# Patient Record
Sex: Male | Born: 1985 | Race: White | Hispanic: No | Marital: Single | Smoking: Never smoker
Health system: Southern US, Community
[De-identification: ages and names within clinical notes are randomized; demographics above are authoritative.]

---

## 2011-08-25 ENCOUNTER — Ambulatory Visit (INDEPENDENT_AMBULATORY_CARE_PROVIDER_SITE_OTHER): Payer: BC Managed Care – PPO | Admitting: Emergency Medicine

## 2011-08-25 VITALS — BP 100/70 | HR 90 | Temp 98.3°F | Resp 16 | Ht 76.0 in | Wt 169.0 lb

## 2011-08-25 DIAGNOSIS — L723 Sebaceous cyst: Secondary | ICD-10-CM

## 2011-08-25 DIAGNOSIS — B081 Molluscum contagiosum: Secondary | ICD-10-CM

## 2011-08-25 NOTE — Patient Instructions (Signed)
HPV Test The HPV (Human papillomavirus) test isused to screen for high-risk types with HPV infection. HPV is a group of about 100 related viruses, of which 40 types are genital viruses. Most HPV viruses cause infections that usually resolve without treatment within 2 years. Some HPV infections can cause skin and genital warts (condylomata). HPV types 16, 18, 31 and 45 are considered high-risk types of HPV. High-risk types of HPV do not usually cause visible warts, but if untreated, may lead to cancers of the outlet of the womb (cervix) or anus. An HPV test identifies the DNA (genetic) strands of the HPV infection. Because the test identifies the DNA strands, the test is also referred to as the HPV DNA test. Although HPV is found in both males and females, the HPV test is only used to screen for cervical cancer in females. This test is recommended for females:  With an abnormal Pap test.   After treatment of an abnormal Pap test.   Aged 59 and older.   After treatment of a high-risk HPV infection.  The HPV test may be done at the same time as a Pap test in females over the age of 65. Both the HPV and Pap test require a sample of cells from the cervix. PREPARATION FOR TEST  You may be asked to avoid douching, tampons or birth canal (vaginal) medicines for 48 hours before the HPV test. You will be asked to urinate before the test. For the HPV test, you will need to lie on an exam table with your feet in stirrups. A spatula will be inserted into the vagina. The spatula will be used to swab the cervix for a cell and mucus sample. The sample will be further evaluated in a lab under a microscope. NORMAL FINDINGS  Normal: High-risk HPV is not found.  Ranges for normal findings may vary among different laboratories and hospitals. You should always check with your doctor after having lab work or other tests done to discuss the meaning of your test results and whether your values are considered within normal  limits. MEANING OF TEST An abnormal HPV test means that high-risk HPV is found. Your caregiver may recommend further testing. Your caregiver will go over the test results with you and discuss the importance and meaning of your results, as well as treatment options and the need for additional tests, if necessary. OBTAINING THE RESULTS  It is your responsibility to obtain your test results. Ask the lab or department performing the test when and how you will get your results. Document Released: 01/20/2004 Document Revised: 12/14/2010 Document Reviewed: 10/04/2004 [facility] Patient Information 2012 [place], [place].

## 2011-08-25 NOTE — Progress Notes (Signed)
   Date:  08/25/2011   Name:  Shawn Franklin   DOB:  1985/05/27   MRN:  [id number] Gender: male  Age: 26 y.o.  PCP:  Tally Due, MD    Chief Complaint: Rash   History of Present Illness:  Shawn Franklin is a 26 y.o. pleasant patient who presents with the following:  Relatively sexually inexperienced man with eruption on suprapubic region and base of penis over the past month or so.  No discharge, pain, blisters, any other rash or complaint.  There is no problem list on file for this patient.   No past medical history on file.  No past surgical history on file.  History  Substance Use Topics  . Smoking status: Never Smoker   . Smokeless tobacco: Not on file  . Alcohol Use: Not on file    No family history on file.  No Known Allergies  Medication list has been reviewed and updated.  No current outpatient prescriptions on file prior to visit.    Review of Systems:  As per HPI, otherwise negative.    Physical Examination: Filed Vitals:   08/25/11 1411  BP: 100/70  Pulse: 90  Temp: 98.3 F (36.8 C)  Resp: 16   Filed Vitals:   08/25/11 1411  Height: 6\' 4"  (1.93 m)  Weight: 169 lb (76.658 kg)   Body mass index is 20.57 kg/(m^2). Ideal Body Weight: Weight in (lb) to have BMI = 25: 205    GEN: WDWN, NAD, Non-toxic, Alert & Oriented x 3 HEENT: Atraumatic, Normocephalic.  Ears and Nose: No external deformity. EXTR: No clubbing/cyanosis/edema NEURO: Normal gait.  PSYCH: Normally interactive. Conversant. Not depressed or anxious appearing.  Calm demeanor.  Genitalia:  Normal male circumcised.  Scrotum and testes normal.  Clusters of discrete 1 mm diam erythematous papules with few on base of phallus.  Assessment and Plan: Molluscum contagiosum  Derm referral  Carmelina Dane, MD

## 2011-10-03 ENCOUNTER — Ambulatory Visit (INDEPENDENT_AMBULATORY_CARE_PROVIDER_SITE_OTHER): Payer: No Typology Code available for payment source | Admitting: Licensed Clinical Social Worker

## 2011-10-03 DIAGNOSIS — F411 Generalized anxiety disorder: Secondary | ICD-10-CM

## 2012-04-22 ENCOUNTER — Ambulatory Visit: Payer: Self-pay

## 2014-07-08 ENCOUNTER — Observation Stay (HOSPITAL_COMMUNITY): Payer: 59 | Admitting: Anesthesiology

## 2014-07-08 ENCOUNTER — Emergency Department (HOSPITAL_COMMUNITY): Payer: 59

## 2014-07-08 ENCOUNTER — Encounter (HOSPITAL_COMMUNITY): Payer: Self-pay | Admitting: Emergency Medicine

## 2014-07-08 ENCOUNTER — Observation Stay (HOSPITAL_COMMUNITY)
Admission: EM | Admit: 2014-07-08 | Discharge: 2014-07-09 | Disposition: A | Discharge status: Home / Self Care | Payer: 59 | Attending: General Surgery | Admitting: General Surgery

## 2014-07-08 ENCOUNTER — Encounter (HOSPITAL_COMMUNITY): Admission: EM | Disposition: A | Discharge status: Home / Self Care | Payer: Self-pay | Source: Home / Self Care

## 2014-07-08 DIAGNOSIS — D72829 Elevated white blood cell count, unspecified: Secondary | ICD-10-CM | POA: Diagnosis not present

## 2014-07-08 DIAGNOSIS — K353 Acute appendicitis with localized peritonitis, without perforation or gangrene: Secondary | ICD-10-CM

## 2014-07-08 DIAGNOSIS — N289 Disorder of kidney and ureter, unspecified: Secondary | ICD-10-CM | POA: Insufficient documentation

## 2014-07-08 DIAGNOSIS — K5730 Diverticulosis of large intestine without perforation or abscess without bleeding: Secondary | ICD-10-CM | POA: Insufficient documentation

## 2014-07-08 DIAGNOSIS — N281 Cyst of kidney, acquired: Secondary | ICD-10-CM | POA: Diagnosis not present

## 2014-07-08 DIAGNOSIS — K37 Unspecified appendicitis: Secondary | ICD-10-CM | POA: Diagnosis present

## 2014-07-08 DIAGNOSIS — R109 Unspecified abdominal pain: Secondary | ICD-10-CM | POA: Diagnosis present

## 2014-07-08 DIAGNOSIS — K3580 Unspecified acute appendicitis: Secondary | ICD-10-CM | POA: Diagnosis present

## 2014-07-08 HISTORY — PX: LAPAROSCOPIC APPENDECTOMY: SHX408

## 2014-07-08 LAB — CBC WITH DIFFERENTIAL/PLATELET
Basophils Absolute: 0 K/uL (ref 0.0–0.1)
Basophils Relative: 0 % (ref 0–1)
Eosinophils Absolute: 0.1 K/uL (ref 0.0–0.7)
Eosinophils Relative: 1 % (ref 0–5)
HCT: 41.9 % (ref 39.0–52.0)
Hemoglobin: 14.4 g/dL (ref 13.0–17.0)
Lymphocytes Relative: 13 % (ref 12–46)
Lymphs Abs: 1.9 K/uL (ref 0.7–4.0)
MCH: 28.3 pg (ref 26.0–34.0)
MCHC: 34.4 g/dL (ref 30.0–36.0)
MCV: 82.3 fL (ref 78.0–100.0)
Monocytes Absolute: 0.8 K/uL (ref 0.1–1.0)
Monocytes Relative: 6 % (ref 3–12)
Neutro Abs: 12.2 K/uL — ABNORMAL HIGH (ref 1.7–7.7)
Neutrophils Relative %: 80 % — ABNORMAL HIGH (ref 43–77)
Platelets: 213 K/uL (ref 150–400)
RBC: 5.09 MIL/uL (ref 4.22–5.81)
RDW: 12.2 % (ref 11.5–15.5)
WBC: 15 K/uL — ABNORMAL HIGH (ref 4.0–10.5)

## 2014-07-08 LAB — BASIC METABOLIC PANEL
ANION GAP: 7 (ref 5–15)
BUN: 24 mg/dL — ABNORMAL HIGH (ref 6–20)
CO2: 24 mmol/L (ref 22–32)
CREATININE: 1.38 mg/dL — AB (ref 0.61–1.24)
Calcium: 9 mg/dL (ref 8.9–10.3)
Chloride: 105 mmol/L (ref 101–111)
GFR calc non Af Amer: >60 mL/min (ref >60)
GLUCOSE: 129 mg/dL — AB (ref 65–99)
Potassium: 3.6 mmol/L (ref 3.5–5.1)
SODIUM: 136 mmol/L (ref 135–145)

## 2014-07-08 LAB — URINALYSIS, ROUTINE W REFLEX MICROSCOPIC
Bilirubin Urine: NEGATIVE
Glucose, UA: NEGATIVE mg/dL
Hgb urine dipstick: NEGATIVE
Ketones, ur: NEGATIVE mg/dL
Leukocytes, UA: NEGATIVE
Nitrite: NEGATIVE
Protein, ur: NEGATIVE mg/dL
Specific Gravity, Urine: 1.023 (ref 1.005–1.030)
Urobilinogen, UA: 0.2 mg/dL (ref 0.0–1.0)
pH: 7.5 (ref 5.0–8.0)

## 2014-07-08 LAB — SURGICAL PCR SCREEN
MRSA, PCR: NEGATIVE
Staphylococcus aureus: POSITIVE — AB

## 2014-07-08 SURGERY — APPENDECTOMY, LAPAROSCOPIC
Anesthesia: General | Site: Abdomen

## 2014-07-08 MED ORDER — BUPIVACAINE-EPINEPHRINE 0.5% -1:200000 IJ SOLN
INTRAMUSCULAR | Status: DC | PRN
  Administered 2014-07-08: 30 mL

## 2014-07-08 MED ORDER — BUPIVACAINE-EPINEPHRINE (PF) 0.25% -1:200000 IJ SOLN
INTRAMUSCULAR | Status: AC
  Filled 2014-07-08: qty 30

## 2014-07-08 MED ORDER — FENTANYL CITRATE (PF) 100 MCG/2ML IJ SOLN: 25.0000 ug | INTRAMUSCULAR | Status: DC | PRN

## 2014-07-08 MED ORDER — DEXTROSE 5 % IV SOLN
INTRAVENOUS | Status: AC
  Filled 2014-07-08: qty 2

## 2014-07-08 MED ORDER — DIPHENHYDRAMINE HCL 50 MG/ML IJ SOLN: 12.5000 mg | Freq: Four times a day (QID) | INTRAMUSCULAR | Status: DC | PRN

## 2014-07-08 MED ORDER — DEXAMETHASONE SODIUM PHOSPHATE 10 MG/ML IJ SOLN
INTRAMUSCULAR | Status: AC
  Filled 2014-07-08: qty 1

## 2014-07-08 MED ORDER — LIDOCAINE HCL (PF) 2 % IJ SOLN
INTRAMUSCULAR | Status: DC | PRN
  Administered 2014-07-08: 20 mg via INTRADERMAL

## 2014-07-08 MED ORDER — KETOROLAC TROMETHAMINE 30 MG/ML IJ SOLN
INTRAMUSCULAR | Status: AC
  Filled 2014-07-08: qty 1

## 2014-07-08 MED ORDER — GLYCOPYRROLATE 0.2 MG/ML IJ SOLN
INTRAMUSCULAR | Status: AC
  Filled 2014-07-08: qty 2

## 2014-07-08 MED ORDER — HEPARIN SODIUM (PORCINE) 5000 UNIT/ML IJ SOLN
5000.0000 [IU] | Freq: Three times a day (TID) | INTRAMUSCULAR | Status: DC
  Administered 2014-07-08 – 2014-07-09 (×2): 5000 [IU] via SUBCUTANEOUS
  Filled 2014-07-08 (×5): qty 1

## 2014-07-08 MED ORDER — LACTATED RINGERS IV SOLN
INTRAVENOUS | Status: DC
  Administered 2014-07-08: 1000 mL via INTRAVENOUS

## 2014-07-08 MED ORDER — IOHEXOL 300 MG/ML  SOLN
100.0000 mL | Freq: Once | INTRAMUSCULAR | Status: AC | PRN
  Administered 2014-07-08: 100 mL via INTRAVENOUS

## 2014-07-08 MED ORDER — LACTATED RINGERS IR SOLN
Status: DC | PRN
  Administered 2014-07-08: 1

## 2014-07-08 MED ORDER — DEXAMETHASONE SODIUM PHOSPHATE 10 MG/ML IJ SOLN
INTRAMUSCULAR | Status: DC | PRN
  Administered 2014-07-08: 10 mg via INTRAVENOUS

## 2014-07-08 MED ORDER — DOCUSATE SODIUM 100 MG PO CAPS
100.0000 mg | ORAL_CAPSULE | Freq: Two times a day (BID) | ORAL | Status: DC
Start: 2014-07-08 — End: 2014-07-09
  Administered 2014-07-08 – 2014-07-09 (×3): 100 mg via ORAL
  Filled 2014-07-08 (×4): qty 1

## 2014-07-08 MED ORDER — SUCCINYLCHOLINE CHLORIDE 20 MG/ML IJ SOLN
INTRAMUSCULAR | Status: DC | PRN
  Administered 2014-07-08: 100 mg via INTRAVENOUS

## 2014-07-08 MED ORDER — ONDANSETRON HCL 4 MG/2ML IJ SOLN
4.0000 mg | Freq: Four times a day (QID) | INTRAMUSCULAR | Status: DC | PRN
  Administered 2014-07-08: 4 mg via INTRAVENOUS

## 2014-07-08 MED ORDER — FENTANYL CITRATE (PF) 100 MCG/2ML IJ SOLN
INTRAMUSCULAR | Status: DC | PRN
Start: 2014-07-08 — End: 2014-07-08
  Administered 2014-07-08: 50 ug via INTRAVENOUS
  Administered 2014-07-08: 100 ug via INTRAVENOUS

## 2014-07-08 MED ORDER — ROCURONIUM BROMIDE 100 MG/10ML IV SOLN
INTRAVENOUS | Status: DC | PRN
  Administered 2014-07-08: 30 mg via INTRAVENOUS

## 2014-07-08 MED ORDER — KETOROLAC TROMETHAMINE 30 MG/ML IJ SOLN
INTRAMUSCULAR | Status: DC | PRN
  Administered 2014-07-08: 30 mg via INTRAVENOUS

## 2014-07-08 MED ORDER — ONDANSETRON HCL 4 MG/2ML IJ SOLN: 4.0000 mg | Freq: Once | INTRAMUSCULAR | Status: DC | PRN

## 2014-07-08 MED ORDER — ONDANSETRON HCL 4 MG/2ML IJ SOLN
INTRAMUSCULAR | Status: AC
  Filled 2014-07-08: qty 2

## 2014-07-08 MED ORDER — FENTANYL CITRATE (PF) 250 MCG/5ML IJ SOLN
INTRAMUSCULAR | Status: AC
  Filled 2014-07-08: qty 5

## 2014-07-08 MED ORDER — SODIUM CHLORIDE 0.9 % IV SOLN
INTRAVENOUS | Status: DC
  Administered 2014-07-08: 17:00:00 via INTRAVENOUS

## 2014-07-08 MED ORDER — GLYCOPYRROLATE 0.2 MG/ML IJ SOLN
INTRAMUSCULAR | Status: DC | PRN
  Administered 2014-07-08: 0.4 mg via INTRAVENOUS

## 2014-07-08 MED ORDER — MORPHINE SULFATE 2 MG/ML IJ SOLN: 1.0000 mg | INTRAMUSCULAR | Status: DC | PRN

## 2014-07-08 MED ORDER — NEOSTIGMINE METHYLSULFATE 10 MG/10ML IV SOLN
INTRAVENOUS | Status: DC | PRN
  Administered 2014-07-08: 3 mg via INTRAVENOUS

## 2014-07-08 MED ORDER — OXYCODONE-ACETAMINOPHEN 5-325 MG PO TABS: 1.0000 | ORAL_TABLET | ORAL | Status: DC | PRN

## 2014-07-08 MED ORDER — DIPHENHYDRAMINE HCL 12.5 MG/5ML PO ELIX: 12.5000 mg | ORAL_SOLUTION | Freq: Four times a day (QID) | ORAL | Status: DC | PRN

## 2014-07-08 MED ORDER — IOHEXOL 300 MG/ML  SOLN
25.0000 mL | Freq: Once | INTRAMUSCULAR | Status: AC | PRN
  Administered 2014-07-08: 25 mL via ORAL

## 2014-07-08 MED ORDER — MIDAZOLAM HCL 5 MG/5ML IJ SOLN
INTRAMUSCULAR | Status: DC | PRN
  Administered 2014-07-08: 2 mg via INTRAVENOUS

## 2014-07-08 MED ORDER — LIDOCAINE HCL (CARDIAC) 20 MG/ML IV SOLN
INTRAVENOUS | Status: AC
  Filled 2014-07-08: qty 5

## 2014-07-08 MED ORDER — DEXTROSE 5 % IV SOLN
1.0000 g | Freq: Three times a day (TID) | INTRAVENOUS | Status: DC
  Administered 2014-07-08: 1 g via INTRAVENOUS
  Administered 2014-07-08: 2 g via INTRAVENOUS
  Filled 2014-07-08 (×2): qty 1

## 2014-07-08 MED ORDER — MIDAZOLAM HCL 2 MG/2ML IJ SOLN
INTRAMUSCULAR | Status: AC
  Filled 2014-07-08: qty 2

## 2014-07-08 MED ORDER — PROPOFOL 10 MG/ML IV BOLUS
INTRAVENOUS | Status: DC | PRN
  Administered 2014-07-08: 200 mg via INTRAVENOUS

## 2014-07-08 MED ORDER — SODIUM CHLORIDE 0.9 % IV SOLN
3.0000 g | Freq: Once | INTRAVENOUS | Status: AC
  Administered 2014-07-08: 3 g via INTRAVENOUS
  Filled 2014-07-08: qty 3

## 2014-07-08 MED ORDER — SODIUM CHLORIDE 0.9 % IV SOLN
INTRAVENOUS | Status: DC
  Administered 2014-07-08: 07:00:00 via INTRAVENOUS

## 2014-07-08 SURGICAL SUPPLY — 34 items
APPLIER CLIP 5 13 M/L LIGAMAX5 (MISCELLANEOUS)
APPLIER CLIP ROT 10 11.4 M/L (STAPLE)
CHLORAPREP W/TINT 26ML (MISCELLANEOUS) ×3 IMPLANT
CLIP APPLIE 5 13 M/L LIGAMAX5 (MISCELLANEOUS) IMPLANT
CLIP APPLIE ROT 10 11.4 M/L (STAPLE) IMPLANT
CUTTER FLEX LINEAR 45M (STAPLE) IMPLANT
DECANTER SPIKE VIAL GLASS SM (MISCELLANEOUS) ×3 IMPLANT
DRAPE LAPAROSCOPIC ABDOMINAL (DRAPES) ×3 IMPLANT
ELECT REM PT RETURN 9FT ADLT (ELECTROSURGICAL) ×3
ELECTRODE REM PT RTRN 9FT ADLT (ELECTROSURGICAL) ×1 IMPLANT
GLOVE BIO SURGEON STRL SZ 6.5 (GLOVE) ×2 IMPLANT
GLOVE BIO SURGEONS STRL SZ 6.5 (GLOVE) ×1
GLOVE BIOGEL PI IND STRL 7.0 (GLOVE) ×1 IMPLANT
GLOVE BIOGEL PI INDICATOR 7.0 (GLOVE) ×2
HANDLE STAPLE EGIA 4 XL (STAPLE) ×3 IMPLANT
KIT BASIN OR (CUSTOM PROCEDURE TRAY) ×3 IMPLANT
LIQUID BAND (GAUZE/BANDAGES/DRESSINGS) ×3 IMPLANT
POUCH SPECIMEN RETRIEVAL 10MM (ENDOMECHANICALS) ×3 IMPLANT
RELOAD 45 VASCULAR/THIN (ENDOMECHANICALS) ×3 IMPLANT
RELOAD EGIA 45 MED/THCK PURPLE (STAPLE) ×3 IMPLANT
RELOAD EGIA 45 TAN VASC (STAPLE) ×3 IMPLANT
RELOAD STAPLE TA45 3.5 REG BLU (ENDOMECHANICALS) ×3 IMPLANT
SET IRRIG TUBING LAPAROSCOPIC (IRRIGATION / IRRIGATOR) ×3 IMPLANT
SHEARS HARMONIC ACE PLUS 36CM (ENDOMECHANICALS) IMPLANT
SLEEVE XCEL OPT CAN 5 100 (ENDOMECHANICALS) IMPLANT
SUT VIC AB 2-0 SH 27 (SUTURE) ×2
SUT VIC AB 2-0 SH 27X BRD (SUTURE) ×1 IMPLANT
SUT VIC AB 4-0 PS2 27 (SUTURE) ×3 IMPLANT
TOWEL OR 17X26 10 PK STRL BLUE (TOWEL DISPOSABLE) ×3 IMPLANT
TRAY FOLEY W/METER SILVER 14FR (SET/KITS/TRAYS/PACK) ×3 IMPLANT
TRAY LAPAROSCOPIC (CUSTOM PROCEDURE TRAY) ×3 IMPLANT
TROCAR BLADELESS OPT 5 100 (ENDOMECHANICALS) ×3 IMPLANT
TROCAR XCEL BLUNT TIP 100MML (ENDOMECHANICALS) ×3 IMPLANT
TUBING INSUFFLATION 10FT LAP (TUBING) ×3 IMPLANT

## 2014-07-08 NOTE — ED Notes (Signed)
Patient returned from CT

## 2014-07-08 NOTE — ED Notes (Signed)
PA at bedside.

## 2014-07-08 NOTE — Anesthesia Procedure Notes (Signed)
Procedure Name: Intubation Date/Time: 07/08/2014 12:34 PM Performed by: Early OsmondEARGLE, Leoda Smithhart E Pre-anesthesia Checklist: Patient identified, Emergency Drugs available, Suction available and Patient being monitored Patient Re-evaluated:Patient Re-evaluated prior to inductionOxygen Delivery Method: Circle System Utilized Preoxygenation: Pre-oxygenation with 100% oxygen Intubation Type: IV induction, Rapid sequence and Cricoid Pressure applied Ventilation: Mask ventilation without difficulty Laryngoscope Size: Miller and 3 Grade View: Grade I Tube type: Oral Tube size: 7.5 mm Number of attempts: 1 Airway Equipment and Method: Stylet Placement Confirmation: ETT inserted through vocal cords under direct vision,  positive ETCO2 and breath sounds checked- equal and bilateral Secured at: 22 cm Tube secured with: Tape Dental Injury: Teeth and Oropharynx as per pre-operative assessment

## 2014-07-08 NOTE — ED Provider Notes (Signed)
CSN: [id number]     Arrival date & time 07/08/14  0222 History   First MD Initiated Contact with Patient 07/08/14 [phone]     Chief Complaint  Patient presents with  . Abdominal Pain     (Consider location/radiation/quality/duration/timing/severity/associated sxs/prior Treatment) Patient is a 29 y.o. male presenting with abdominal pain. The history is provided by the patient. No language interpreter was used.  Abdominal Pain Pain location:  RLQ Associated symptoms: chills   Associated symptoms: no diarrhea, no dysuria, no fever and no nausea   Associated symptoms comment:  He started having "gas" pain in generalized abdomen last evening that increased over time and localized to RLQ abdomen. He states his mouth "feels dry" but no nausea or vomiting. No diarrhea. No fever at home. He denies urinary symptoms.   History reviewed. No pertinent past medical history. History reviewed. No pertinent past surgical history. History reviewed. No pertinent family history. History  Substance Use Topics  . Smoking status: Never Smoker   . Smokeless tobacco: Not on file  . Alcohol Use: Yes     Comment: social    Review of Systems  Constitutional: Positive for chills. Negative for fever.  Respiratory: Negative.   Cardiovascular: Negative.   Gastrointestinal: Positive for abdominal pain. Negative for nausea and diarrhea.  Genitourinary: Negative.  Negative for dysuria.  Musculoskeletal: Negative.   Skin: Negative.   Neurological: Negative.       Allergies  Review of patient's allergies indicates no known allergies.  Home Medications   Prior to Admission medications   Not on File   BP 144/87 mmHg  Pulse 61  Temp(Src) 98.2 F (36.8 C) (Oral)  Resp 18  Ht 6\' 4"  (1.93 m)  Wt 190 lb (86.183 kg)  BMI 23.14 kg/m2  SpO2 100% Physical Exam  Constitutional: He is oriented to person, place, and time. He appears well-developed and well-nourished.  Neck: Normal range of motion.   Pulmonary/Chest: Effort normal.  Abdominal: Soft. There is tenderness. There is rebound. There is no guarding.  RLQ abdominal tenderness toward midline with rebound. Soft abdomen.   Musculoskeletal: Normal range of motion.  Neurological: He is alert and oriented to person, place, and time.  Skin: Skin is warm and dry.  Psychiatric: He has a normal mood and affect.    ED Course  Procedures (including critical care time) Labs Review Labs Reviewed  CBC WITH DIFFERENTIAL/PLATELET  BASIC METABOLIC PANEL   Results for orders placed or performed during the hospital encounter of 07/08/14  CBC with Differential  Result Value Ref Range   WBC 15.0 (H) 4.0 - 10.5 K/uL   RBC 5.09 4.22 - 5.81 MIL/uL   Hemoglobin 14.4 13.0 - 17.0 g/dL   HCT 11.941.9 14.739.0 - 82.952.0 %   MCV 82.3 78.0 - 100.0 fL   MCH 28.3 26.0 - 34.0 pg   MCHC 34.4 30.0 - 36.0 g/dL   RDW 56.212.2 13.011.5 - 86.515.5 %   Platelets 213 150 - 400 K/uL   Neutrophils Relative % 80 (H) 43 - 77 %   Neutro Abs 12.2 (H) 1.7 - 7.7 K/uL   Lymphocytes Relative 13 12 - 46 %   Lymphs Abs 1.9 0.7 - 4.0 K/uL   Monocytes Relative 6 3 - 12 %   Monocytes Absolute 0.8 0.1 - 1.0 K/uL   Eosinophils Relative 1 0 - 5 %   Eosinophils Absolute 0.1 0.0 - 0.7 K/uL   Basophils Relative 0 0 - 1 %   Basophils Absolute  0.0 0.0 - 0.1 K/uL  Basic metabolic panel  Result Value Ref Range   Sodium 136 135 - 145 mmol/L   Potassium 3.6 3.5 - 5.1 mmol/L   Chloride 105 101 - 111 mmol/L   CO2 24 22 - 32 mmol/L   Glucose, Bld 129 (H) 65 - 99 mg/dL   BUN 24 (H) 6 - 20 mg/dL   Creatinine, Ser 1.61 (H) 0.61 - 1.24 mg/dL   Calcium 9.0 8.9 - 09.6 mg/dL   GFR calc non Af Amer >60 >60 mL/min   GFR calc Af Amer >60 >60 mL/min   Anion gap 7 5 - 15  Urinalysis, Routine w reflex microscopic (not at [facility])  Result Value Ref Range   Color, Urine YELLOW YELLOW   APPearance CLEAR CLEAR   Specific Gravity, Urine 1.023 1.005 - 1.030   pH 7.5 5.0 - 8.0   Glucose, UA NEGATIVE NEGATIVE  mg/dL   Hgb urine dipstick NEGATIVE NEGATIVE   Bilirubin Urine NEGATIVE NEGATIVE   Ketones, ur NEGATIVE NEGATIVE mg/dL   Protein, ur NEGATIVE NEGATIVE mg/dL   Urobilinogen, UA 0.2 0.0 - 1.0 mg/dL   Nitrite NEGATIVE NEGATIVE   Leukocytes, UA NEGATIVE NEGATIVE    Ct Abdomen Pelvis W Contrast  07/08/2014   CLINICAL DATA:  Abdominal cramping O worsening throughout the day. Leukocytosis.  EXAM: CT ABDOMEN AND PELVIS WITH CONTRAST  TECHNIQUE: Multidetector CT imaging of the abdomen and pelvis was performed using the standard protocol following bolus administration of intravenous contrast.  CONTRAST:  OMNIPAQUE IOHEXOL 300 MG/ML  SOLN  COMPARISON:  None.  FINDINGS: There is inflammation and enlargement of the appendix with appearances typical of acute appendicitis. There is no abscess. There is no extraluminal air. There is no ascites.  There are normal appearances of the liver, spleen, pancreas, adrenals and kidneys with the exception of a 9 mm cyst at the anterior aspect of the right kidney midpole. The abdominal aorta is normal in caliber. There is no atherosclerotic calcification. There is no adenopathy in the abdomen or pelvis.  There is mild colonic diverticulosis. There are otherwise normal appearances of the colon, small bowel and stomach.  There is no significant abnormality in the lower chest. There is no significant musculoskeletal abnormality.  IMPRESSION: Acute appendicitis. These results were called by telephone at the time of interpretation on 07/08/2014 at 4:39 am , with verbal acknowledgement of these results.   Electronically Signed   By: Ellery Plunk M.D.   On: 07/08/2014 04:42    Imaging Review No results found.   EKG Interpretation None      MDM   Final diagnoses:  None    1. Appendicitis  4:00: resting comfortably. Completed oral CM for CT scan.  5:00: discussed positive acute appendicitis CT results with Dr. Carolynne Edouard who advises the oncoming team will be made  aware and will see the patient for admission.   Elpidio Anis, PA-C 07/08/14 2016  Raeford Razor, MD 07/09/14 [phone]

## 2014-07-08 NOTE — ED Notes (Addendum)
Patient c/o abd cramping earlier in the day, states the pain has worsened throughout the day. Denies N/V, denies diarrhea. Last BM 07/08/2014, small amount. Patient c/o right sided abd tenderness on palpation. Patient also c/o chills at home. Last meal @2000  07/07/2014
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
## 2014-07-08 NOTE — ED Notes (Signed)
Patient transported to CT 

## 2014-07-08 NOTE — Anesthesia Preprocedure Evaluation (Signed)
Anesthesia Evaluation  Patient identified by MRN, date of birth, ID band Patient awake    Reviewed: Allergy & Precautions, NPO status , Patient's Chart, lab work & pertinent test results  History of Anesthesia Complications Negative for: history of anesthetic complications  Airway Mallampati: II  TM Distance: >3 FB Neck ROM: Full    Dental no notable dental hx. (+) Dental Advisory Given   Pulmonary neg pulmonary ROS,  breath sounds clear to auscultation  Pulmonary exam normal       Cardiovascular negative cardio ROS Normal cardiovascular examRhythm:Regular Rate:Normal     Neuro/Psych negative neurological ROS  negative psych ROS   GI/Hepatic negative GI ROS, Neg liver ROS,   Endo/Other  negative endocrine ROS  Renal/GU negative Renal ROS  negative genitourinary   Musculoskeletal negative musculoskeletal ROS (+)   Abdominal   Peds negative pediatric ROS (+)  Hematology negative hematology ROS (+)   Anesthesia Other Findings   Reproductive/Obstetrics negative OB ROS                             Anesthesia Physical Anesthesia Plan  ASA: II and emergent  Anesthesia Plan: General   Post-op Pain Management:    Induction: Intravenous  Airway Management Planned: Oral ETT  Additional Equipment:   Intra-op Plan:   Post-operative Plan: Extubation in OR  Informed Consent: I have reviewed the patients History and Physical, chart, labs and discussed the procedure including the risks, benefits and alternatives for the proposed anesthesia with the patient or authorized representative who has indicated his/her understanding and acceptance.   Dental advisory given  Plan Discussed with: CRNA  Anesthesia Plan Comments:         Anesthesia Quick Evaluation

## 2014-07-08 NOTE — Transfer of Care (Signed)
Immediate Anesthesia Transfer of Care Note  Patient: Shawn Franklin  Procedure(s) Performed: Procedure(s): APPENDECTOMY LAPAROSCOPIC (N/A)  Patient Location: PACU  Anesthesia Type:General  Level of Consciousness:  sedated, patient cooperative and responds to stimulation  Airway & Oxygen Therapy:Patient Spontanous Breathing and Patient connected to face mask oxgen  Post-op Assessment:  Report given to PACU RN and Post -op Vital signs reviewed and stable  Post vital signs:  Reviewed and stable  Last Vitals:  Filed Vitals:   07/08/14 0940  BP: 126/73  Pulse: 78  Temp: 36.8 C  Resp: 14    Complications: No apparent anesthesia complications

## 2014-07-08 NOTE — Op Note (Signed)
Shawn Franklin [id number]   PRE-OPERATIVE DIAGNOSIS:  appendicitis  POST-OPERATIVE DIAGNOSIS:  appendicitis   Procedure(s): APPENDECTOMY LAPAROSCOPIC  SURGEON:  Surgeon(s): Romie LeveeAlicia Lashauna Arpin, MD  ASSISTANT: none   ANESTHESIA:   local and general  EBL:   min  Delay start of Pharmacological VTE agent (>24hrs) due to surgical blood loss or risk of bleeding:  no  DRAINS: none   SPECIMEN:  Source of Specimen:  appendix  DISPOSITION OF SPECIMEN:  PATHOLOGY  COUNTS:  YES  PLAN OF CARE: Admit for overnight observation  PATIENT DISPOSITION:  PACU - hemodynamically stable.   INDICATIONS: Patient with concerning symptoms & work up suspicious for appendicitis.  Surgery was recommended:  The anatomy & physiology of the digestive tract was discussed.  The pathophysiology of appendicitis was discussed.  Natural history risks without surgery was discussed.   I feel the risks of no intervention will lead to serious problems that outweigh the operative risks; therefore, I recommended diagnostic laparoscopy with removal of appendix to remove the pathology.  Laparoscopic & open techniques were discussed.   I noted a good likelihood this will help address the problem.    Risks such as bleeding, infection, abscess, leak, reoperation, possible ostomy, hernia, heart attack, death, and other risks were discussed.  Goals of post-operative recovery were discussed as well.  We will work to minimize complications.  Questions were answered.  The patient expresses understanding & wishes to proceed with surgery.  OR FINDINGS: acute appendicitis  DESCRIPTION:   The patient was identified & brought into the operating room. The patient was positioned supine with left arm tucked. SCDs were active during the entire case. The patient underwent general anesthesia without any difficulty.  A foley catheter was inserted under sterile conditions. The abdomen was prepped and draped in a sterile fashion. A Surgical  Timeout confirmed our plan.   I made a transverse incision through the superior umbilical fold.  I made a nick in the infraumbilical fascia and confirmed peritoneal entry.  I placed a stay suture and then the [facility].  We induced carbon dioxide insufflation.  Camera inspection revealed no injury.  I placed additional ports under direct laparoscopic visualization.  I mobilized the terminal ileum to proximal ascending colon in a lateral to medial fashion.  I took care to avoid injuring any retroperitoneal structures.   I freed the appendix off its attachments to the ascending colon and cecal mesentery.  I elevated the appendix.  I was able to free off the base of the appendix, which was still viable.  I stapled the appendix off the cecum using a laparoscopic bowel load stapler.  I took a healthy cuff viable cecum. I skeletonized & ligated the mesoappendix with a vascular load stapler.   I placed the appendix inside an EndoCatch bag and removed out the [facility] port.  I did copious irrigation. Hemostasis was good in the mesoappendix, colon mesentery, and retroperitoneum. Staple line was intact on the cecum with no bleeding. I washed out the pelvis, retrohepatic space and right paracolic gutter.  Hemostasis is good. There was no perforation or injury.  Because the area cleaned up well after irrigation, I did not place a drain.  I aspirated the carbon dioxide. I removed the ports. I closed the umbilical fascia site using a 0 Vicryl stitch. I closed skin using 4-0 vicryl stitch.  Sterile dressings were applied.  Patient was extubated and sent to the recovery room.  I suspect the patient is going used in the hospital  at least overnight and will need antibiotics for 0 days.

## 2014-07-08 NOTE — Anesthesia Postprocedure Evaluation (Signed)
  Anesthesia Post-op Note  Patient: Shawn Franklin  Procedure(s) Performed: Procedure(s) (LRB): APPENDECTOMY LAPAROSCOPIC (N/A)  Patient Location: PACU  Anesthesia Type: General  Level of Consciousness: awake and alert   Airway and Oxygen Therapy: Patient Spontanous Breathing  Post-op Pain: mild  Post-op Assessment: Post-op Vital signs reviewed, Patient's Cardiovascular Status Stable, Respiratory Function Stable, Patent Airway and No signs of Nausea or vomiting  Last Vitals:  Filed Vitals:   07/08/14 1415  BP: 132/75  Pulse: 68  Temp: 36.9 C  Resp: 16    Post-op Vital Signs: stable   Complications: No apparent anesthesia complications

## 2014-07-09 ENCOUNTER — Encounter (HOSPITAL_COMMUNITY): Payer: Self-pay | Admitting: General Surgery

## 2014-07-09 MED ORDER — OXYCODONE-ACETAMINOPHEN 5-325 MG PO TABS: 1.0000 | ORAL_TABLET | ORAL | Status: AC | PRN

## 2014-07-09 NOTE — Discharge Summary (Signed)
Patient ID: Shawn Franklin MRN: [id number] DOB/AGE: [birth year] 29 y.o.  Admit date: 07/08/2014 Discharge date: 07/09/2014  Procedures: lap appy  Consults: None  Reason for Admission: 29 y/o presents with abdominal pain. He says it started about yesterday after lunch. He said it was more of a cramp than a pain. Persisted all night, and gradually got worse, he couldn't sleep. Pain start going more in the RLQ. He came to the ED about 2 AM. Work up shows: Afebrile, VSS Creatinine 1.38, WBC 15.0, CT scan shows acute appendicitis. He has not had nausea or vomiting, ate around 2 PM yesterday, but not particularly dehydrated yesterday.   Admission Diagnoses:  1. Acute appendicitis 2. Mild renal insufficiency   Hospital Course: The patient was admitted and taken to the OR where he underwent a lap appy.  He tolerated this procedure well.  On POD 1, his pain was well controlled and he was tolerating a regular diet.  He was stable for dc home.  PE: Abd: soft, appropriately tender, +BS, ND, incisions c/d/i  Discharge Diagnoses:  Active Problems:   Acute appendicitis S/p lap appy  Discharge Medications:   Medication List    TAKE these medications        oxyCODONE-acetaminophen 5-325 MG per tablet  Commonly known as:  PERCOCET/ROXICET  Take 1-2 tablets by mouth every 4 (four) hours as needed for moderate pain.        Discharge Instructions:     Follow-up Information    Follow up with [facility] On 07/27/2014.   Specialty:  General Surgery   Why:  [facility], 3:30pm, arrive by 3:00pm for paperwork   Contact information:   [address] [place] [address] [phone]       Signed: Letha CapeOSBORNE,Jameela Michna E 07/09/2014, 11:19 AM

## 2014-07-09 NOTE — Discharge Instructions (Signed)
[facility] ______[facility] °LAPAROSCOPIC SURGERY: POST OP INSTRUCTIONS °Always review your discharge instruction sheet given to you by the facility where your surgery was performed. °IF YOU HAVE DISABILITY OR FAMILY LEAVE FORMS, YOU MUST BRING THEM TO THE OFFICE FOR PROCESSING.   °DO NOT GIVE THEM TO YOUR DOCTOR. ° °1. A prescription for pain medication may be given to you upon discharge.  Take your pain medication as prescribed, if needed.  If narcotic pain medicine is not needed, then you may take acetaminophen (Tylenol) or ibuprofen (Advil) as needed. °2. Take your usually prescribed medications unless otherwise directed. °3. If you need a refill on your pain medication, please contact your pharmacy.  They will contact our office to request authorization. Prescriptions will not be filled after 5pm or on week-ends. °4. You should follow a light diet the first few days after arrival home, such as soup and crackers, etc.  Be sure to include lots of fluids daily. °5. Most patients will experience some swelling and bruising in the area of the incisions.  Ice packs will help.  Swelling and bruising can take several days to resolve.  °6. It is common to experience some constipation if taking pain medication after surgery.  Increasing fluid intake and taking a stool softener (such as Colace) will usually help or prevent this problem from occurring.  A mild laxative (Milk of Magnesia or Miralax) should be taken according to package instructions if there are no bowel movements after 48 hours. °7. Unless discharge instructions indicate otherwise, you may remove your bandages 24-48 hours after surgery, and you may shower at that time.  You may have steri-strips (small skin tapes) in place directly over the incision.  These strips should be left on the skin for 7-10 days.  If your surgeon used skin glue on the incision, you may shower in 24 hours.  The glue will flake off over the next 2-3 weeks.  Any sutures or  staples will be removed at the office during your follow-up visit. °8. ACTIVITIES:  You may resume regular (light) daily activities beginning the next day--such as daily self-care, walking, climbing stairs--gradually increasing activities as tolerated.  You may have sexual intercourse when it is comfortable.  Refrain from any heavy lifting or straining until approved by your doctor. °a. You may drive when you are no longer taking prescription pain medication, you can comfortably wear a seatbelt, and you can safely maneuver your car and apply brakes. °b. RETURN TO WORK:  __________________________________________________________ °9. You should see your doctor in the office for a follow-up appointment approximately 2-3 weeks after your surgery.  Make sure that you call for this appointment within a day or two after you arrive home to insure a convenient appointment time. °10. OTHER INSTRUCTIONS: __________________________________________________________________________________________________________________________ __________________________________________________________________________________________________________________________ °WHEN TO CALL YOUR DOCTOR: °1. Fever over 101.0 °2. Inability to urinate °3. Continued bleeding from incision. °4. Increased pain, redness, or drainage from the incision. °5. Increasing abdominal pain ° °The clinic staff is available to answer your questions during regular business hours.  Please don’t hesitate to call and ask to speak to one of the nurses for clinical concerns.  If you have a medical emergency, go to the nearest emergency room or call 911.  A surgeon from [facility] is always on call at the hospital. °[address], [address], [place], [place]  [address] ? [address] [address], , [place]   [address] °[phone] ? [phone] ? FAX [phone] °Web site:   [email or web address] °

## 2014-07-09 NOTE — Progress Notes (Signed)
Patient alert and oriented. Given discharge instructions and prescription for pain management. All questions and concerns answered prior to discharge.

## 2016-01-20 IMAGING — CT CT ABD-PELV W/ CM
2 of 4 series · 16 of 46 positions shown, 18 images · IV contrast (100 ML OMNI 300)
Comparison: None.

CLINICAL DATA: Abdominal cramping O worsening throughout the day.
Leukocytosis.

EXAM:
CT ABDOMEN AND PELVIS WITH CONTRAST
TECHNIQUE: Multidetector CT imaging of the abdomen and pelvis was performed
using the standard protocol following bolus administration of
intravenous contrast.
CONTRAST:  100mL OMNIPAQUE IOHEXOL 300 MG/ML  SOLN

[Series 2: abd/pel with · axial · 0.69mm/px · z∈[+1005,+1410]mm · 13 of 89 slices shown, 15 images]
[im 4/89  soft-tissue]
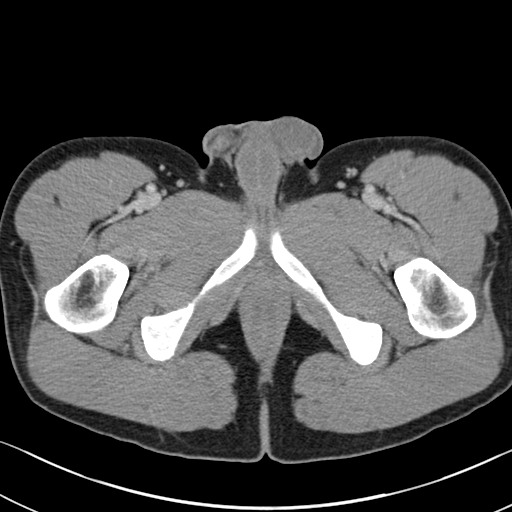
[im 4/89  bone]
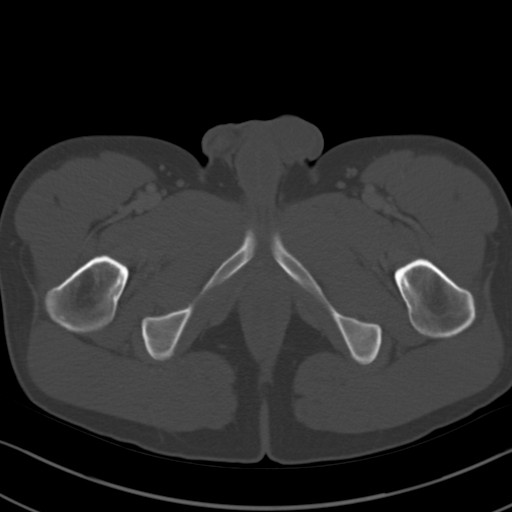
[im 11/89  soft-tissue]
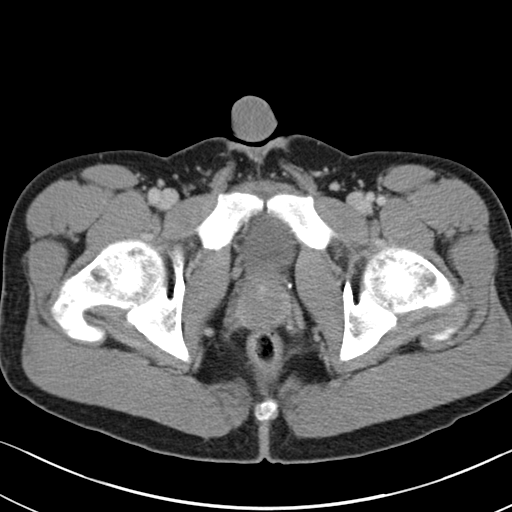
[im 18/89  soft-tissue]
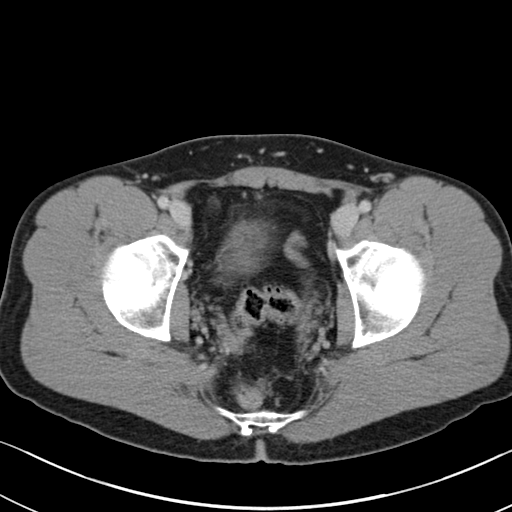
[im 25/89  soft-tissue]
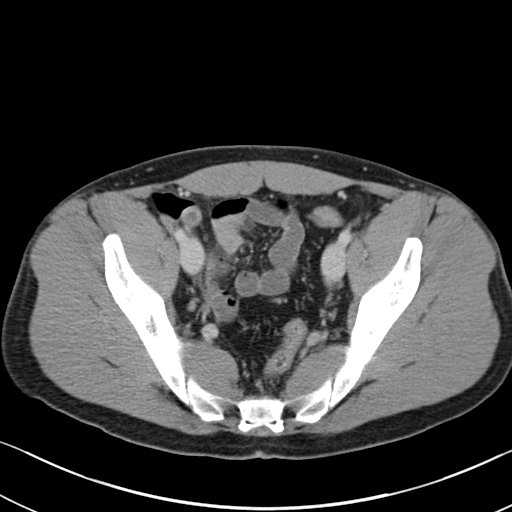
[im 32/89  soft-tissue]
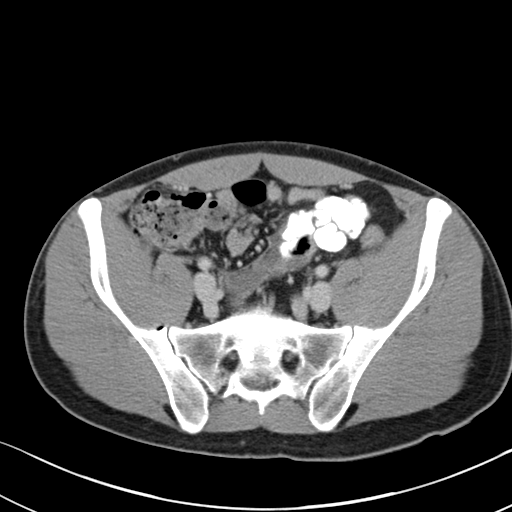
[im 39/89  soft-tissue]
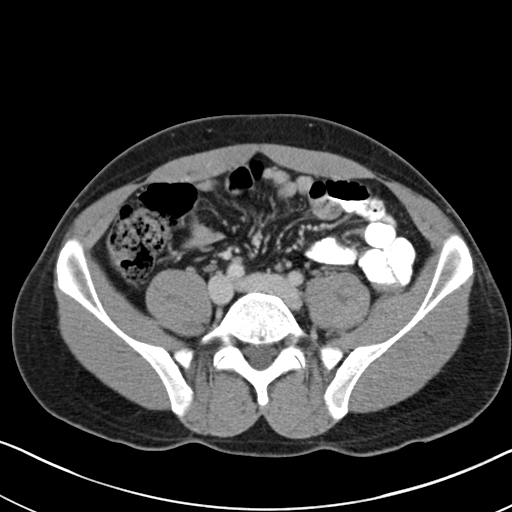
[im 46/89  soft-tissue]
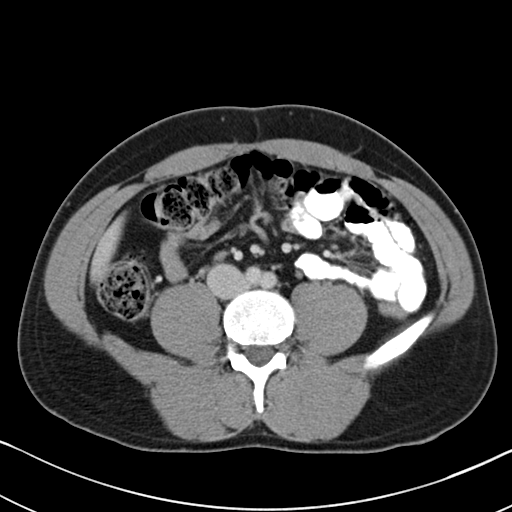
[im 50/89  soft-tissue]
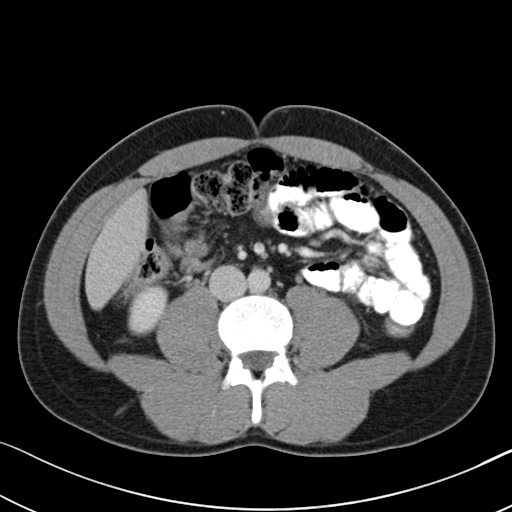
[im 57/89  soft-tissue]
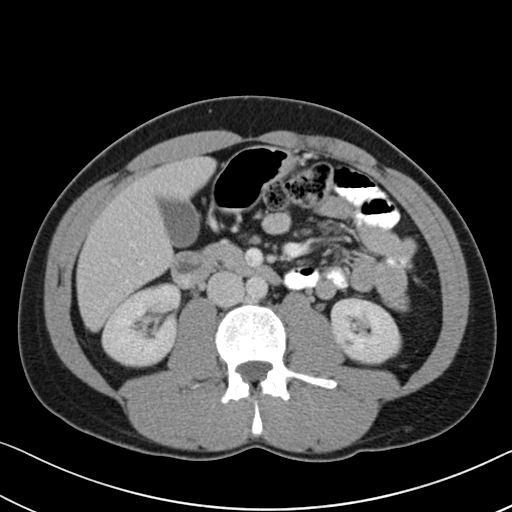
[im 57/89  bone]
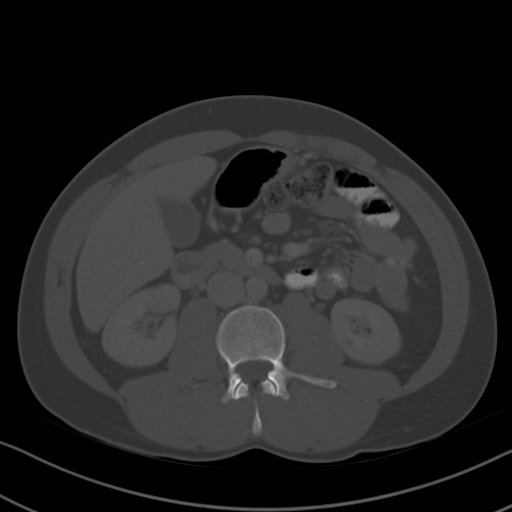
[im 64/89  soft-tissue]
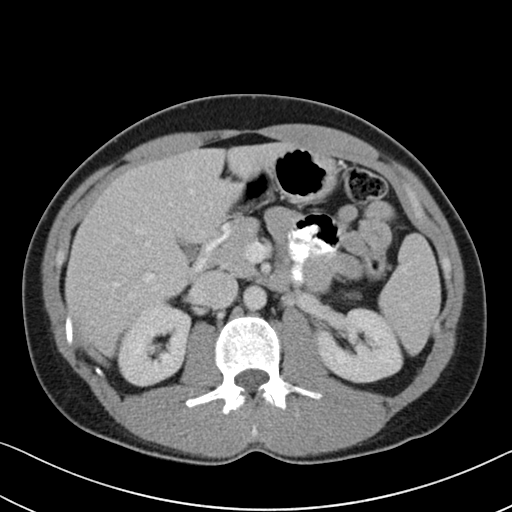
[im 71/89  soft-tissue]
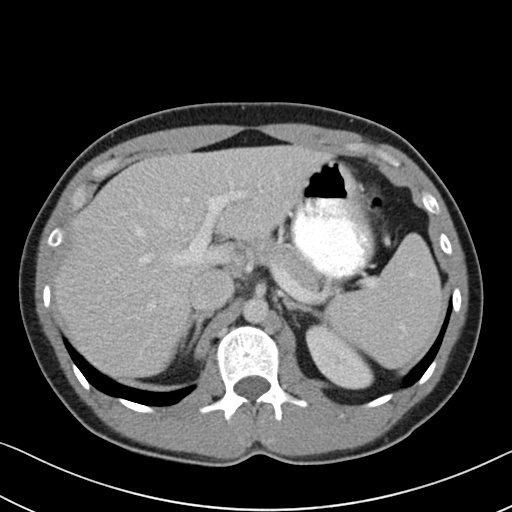
[im 78/89  soft-tissue]
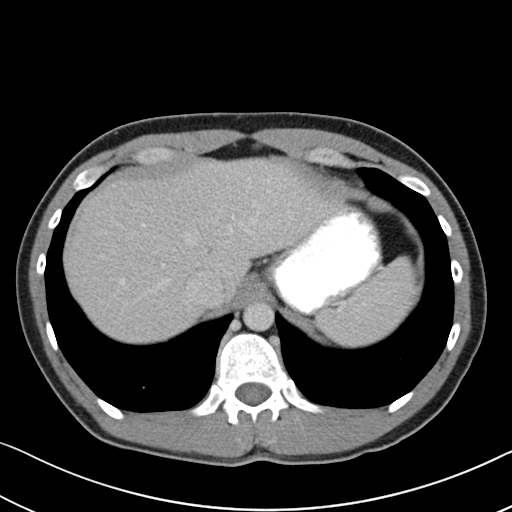
[im 85/89  soft-tissue]
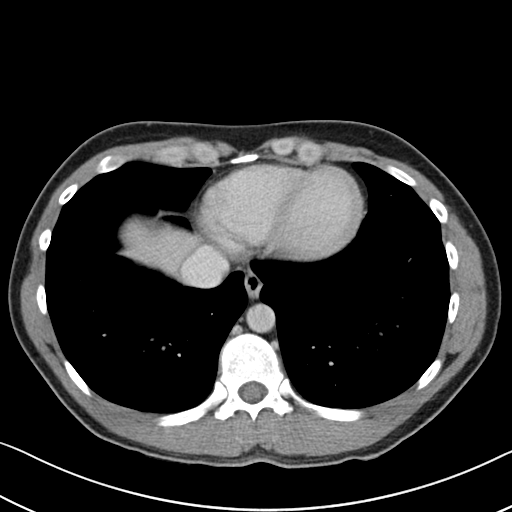

[Series 4: coronal a/|p · coronal · 0.64mm/px · 3 of 79 slices shown]
[im 27/79  soft-tissue]
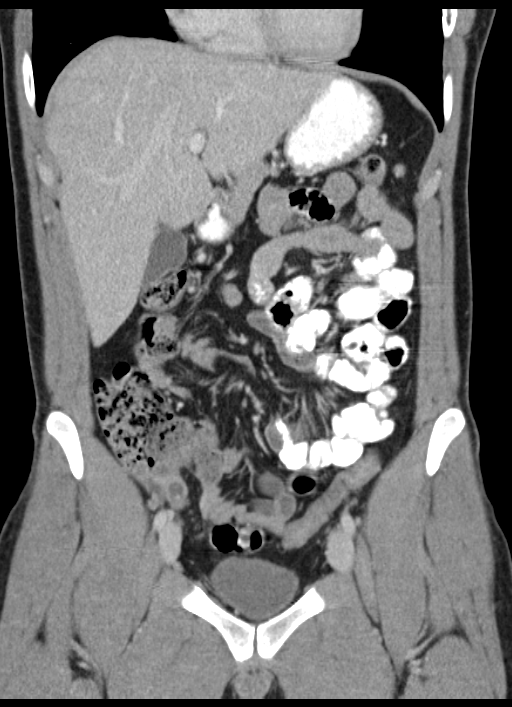
[im 35/79  soft-tissue]
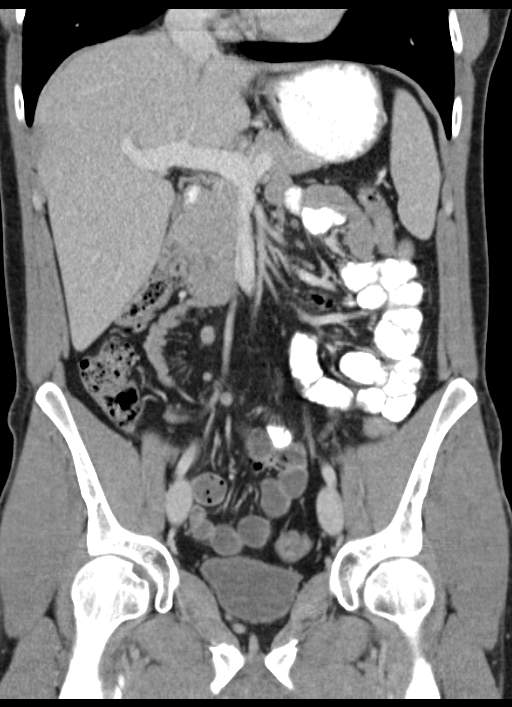
[im 44/79  soft-tissue]
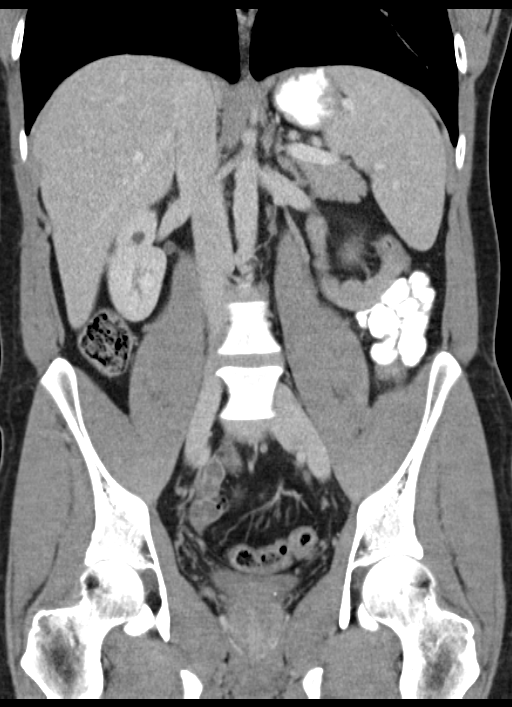

[16 of 46 positions shown; findings below may reference images not displayed]

FINDINGS: There is inflammation and enlargement of the appendix with
appearances typical of acute appendicitis. There is no abscess.
There is no extraluminal air. There is no ascites.

There are normal appearances of the liver, spleen, pancreas,
adrenals and kidneys with the exception of a 9 mm cyst at the
anterior aspect of the right kidney midpole. The abdominal aorta is
normal in caliber. There is no atherosclerotic calcification. There
is no adenopathy in the abdomen or pelvis.

There is mild colonic diverticulosis. There are otherwise normal
appearances of the colon, small bowel and stomach.

There is no significant abnormality in the lower chest. There is no
significant musculoskeletal abnormality.
IMPRESSION: Acute appendicitis. These results were called by telephone at the
time of interpretation on 07/08/2014 at [DATE] , with verbal
acknowledgement of these results.
